# Patient Record
Sex: Female | Born: 1975 | Hispanic: No | Marital: Married
Health system: Southern US, Community
[De-identification: ages and names within clinical notes are randomized; demographics above are authoritative.]

## PROBLEM LIST (undated history)

## (undated) DIAGNOSIS — J45909 Unspecified asthma, uncomplicated: Secondary | ICD-10-CM

## (undated) HISTORY — PX: HERNIA REPAIR: SHX51

## (undated) HISTORY — PX: FOOT SURGERY: SHX648

---

## 1999-01-06 ENCOUNTER — Other Ambulatory Visit: Admission: RE | Admit: 1999-01-06 | Discharge: 1999-01-06 | Payer: Self-pay | Admitting: Family Medicine

## 2020-09-05 ENCOUNTER — Emergency Department (HOSPITAL_BASED_OUTPATIENT_CLINIC_OR_DEPARTMENT_OTHER)
Admission: EM | Admit: 2020-09-05 | Discharge: 2020-09-05 | Disposition: A | Discharge status: Home / Self Care | Payer: Self-pay | Attending: Emergency Medicine | Admitting: Emergency Medicine

## 2020-09-05 ENCOUNTER — Encounter (HOSPITAL_BASED_OUTPATIENT_CLINIC_OR_DEPARTMENT_OTHER): Payer: Self-pay | Admitting: Emergency Medicine

## 2020-09-05 ENCOUNTER — Emergency Department (HOSPITAL_BASED_OUTPATIENT_CLINIC_OR_DEPARTMENT_OTHER): Payer: Self-pay

## 2020-09-05 ENCOUNTER — Other Ambulatory Visit: Payer: Self-pay

## 2020-09-05 DIAGNOSIS — R0789 Other chest pain: Secondary | ICD-10-CM | POA: Insufficient documentation

## 2020-09-05 DIAGNOSIS — J45909 Unspecified asthma, uncomplicated: Secondary | ICD-10-CM | POA: Insufficient documentation

## 2020-09-05 HISTORY — DX: Unspecified asthma, uncomplicated: J45.909

## 2020-09-05 LAB — BASIC METABOLIC PANEL
Anion gap: 5 (ref 5–15)
BUN: 16 mg/dL (ref 6–20)
CO2: 27 mmol/L (ref 22–32)
Calcium: 9 mg/dL (ref 8.9–10.3)
Chloride: 105 mmol/L (ref 98–111)
Creatinine, Ser: 0.74 mg/dL (ref 0.44–1.00)
GFR, Estimated: >60 mL/min (ref >60)
Glucose, Bld: 98 mg/dL (ref 70–99)
Potassium: 3.9 mmol/L (ref 3.5–5.1)
Sodium: 137 mmol/L (ref 135–145)

## 2020-09-05 LAB — CBC
HCT: 39 % (ref 36.0–46.0)
Hemoglobin: 12.7 g/dL (ref 12.0–15.0)
MCH: 26.8 pg (ref 26.0–34.0)
MCHC: 32.6 g/dL (ref 30.0–36.0)
MCV: 82.5 fL (ref 80.0–100.0)
Platelets: 233 10*3/uL (ref 150–400)
RBC: 4.73 MIL/uL (ref 3.87–5.11)
RDW: 12.6 % (ref 11.5–15.5)
WBC: 4.2 10*3/uL (ref 4.0–10.5)
nRBC: 0 % (ref 0.0–0.2)

## 2020-09-05 LAB — TROPONIN I (HIGH SENSITIVITY)
Troponin I (High Sensitivity): <2 ng/L
Troponin I (High Sensitivity): <2 ng/L (ref <18)

## 2020-09-05 LAB — PREGNANCY, URINE: Preg Test, Ur: NEGATIVE

## 2020-09-05 MED ORDER — LACTATED RINGERS IV BOLUS
1000.0000 mL | Freq: Once | INTRAVENOUS | Status: AC
  Administered 2020-09-05: 1000 mL via INTRAVENOUS

## 2020-09-05 NOTE — ED Triage Notes (Signed)
Pt reports "constant" chest pain that started 3 days ago. Pt also reports left arm pain.

## 2020-09-05 NOTE — ED Provider Notes (Signed)
[facility] EMERGENCY DEPARTMENT Provider Note   CSN: [id number] Arrival date & time: 09/05/20  9728     History Chief Complaint  Patient presents with   Chest Pain    Deanna Lamb is a 45 y.o. female.  The history is provided by the patient.  Chest Pain Pain location:  L chest and L lateral chest Pain quality: aching and tightness   Pain radiates to:  Does not radiate Pain severity:  Moderate Onset quality:  Gradual Duration:  3 days Timing:  Constant Progression:  Unchanged Chronicity:  New Context comment:  Nothing seems to make it better or worse Relieved by:  Nothing Worsened by:  Nothing Ineffective treatments: Ibuprofen and Tagamet. Associated symptoms: no abdominal pain, no back pain, no cough, no fever, no lower extremity edema, no nausea, no palpitations, no shortness of breath, no syncope, no vomiting and no weakness   Risk factors comment:  History of asthma and depression     Past Medical History:  Diagnosis Date   Asthma     There are no problems to display for this patient.   Past Surgical History:  Procedure Laterality Date   FOOT SURGERY Right    HERNIA REPAIR       OB History   No obstetric history on file.     No family history on file.     Home Medications Prior to Admission medications   Medication Sig Start Date End Date Taking? Authorizing Provider  ALPRAZolam Prudy Feeler) 0.5 MG tablet Take 0.5 mg by mouth at bedtime as needed for anxiety.   Yes [provider]  escitalopram (LEXAPRO) 20 MG tablet Take 20 mg by mouth daily.   Yes [provider]    Allergies    Patient has no known allergies.  Review of Systems   Review of Systems  Constitutional:  Negative for fever.  Respiratory:  Negative for cough and shortness of breath.   Cardiovascular:  Positive for chest pain. Negative for palpitations and syncope.  Gastrointestinal:  Negative for abdominal pain, nausea and vomiting.  Musculoskeletal:   Negative for back pain.  Neurological:  Negative for weakness.  All other systems reviewed and are negative.  Physical Exam Updated Vital Signs BP 114/66 (BP Location: Right Arm)   Pulse 60   Temp 97.9 F (36.6 C) (Oral)   Resp 20   Ht 5\' 3"  (1.6 m)   Wt 58.1 kg   LMP 08/21/2020   SpO2 100%   BMI 22.67 kg/m   Physical Exam Vitals and nursing note reviewed.  Constitutional:      General: She is not in acute distress.    Appearance: Normal appearance. She is well-developed and normal weight.  HENT:     Head: Normocephalic and atraumatic.     Mouth/Throat:     Mouth: Mucous membranes are moist.  Eyes:     Pupils: Pupils are equal, round, and reactive to light.  Cardiovascular:     Rate and Rhythm: Normal rate and regular rhythm.     Pulses: Normal pulses.     Heart sounds: Normal heart sounds. No murmur heard.   No friction rub.  Pulmonary:     Effort: Pulmonary effort is normal.     Breath sounds: Normal breath sounds. No wheezing or rales.     Comments: Mild tenderness in the left chest wall.  No rashes present Chest:     Chest wall: Tenderness present.  Abdominal:     General: Bowel sounds  are normal. There is no distension.     Palpations: Abdomen is soft.     Tenderness: There is no abdominal tenderness. There is no guarding or rebound.  Musculoskeletal:        General: No tenderness. Normal range of motion.     Right lower leg: No edema.     Left lower leg: No edema.     Comments: No edema  Skin:    General: Skin is warm and dry.     Findings: No rash.  Neurological:     General: No focal deficit present.     Mental Status: She is alert and oriented to person, place, and time. Mental status is at baseline.     Cranial Nerves: No cranial nerve deficit.  Psychiatric:        Mood and Affect: Mood normal.        Behavior: Behavior normal.    ED Results / Procedures / Treatments   Labs (all labs ordered are listed, but only abnormal results are  displayed) Labs Reviewed  BASIC METABOLIC PANEL  CBC  PREGNANCY, URINE  TROPONIN I (HIGH SENSITIVITY)  TROPONIN I (HIGH SENSITIVITY)    EKG EKG Interpretation  Date/Time:  Friday September 05 2020 06:56:51 EDT Ventricular Rate:  67 PR Interval:  144 QRS Duration: 84 QT Interval:  396 QTC Calculation: 418 R Axis:   60 Text Interpretation: Normal sinus rhythm Normal ECG No previous tracing Confirmed by Gwyneth Sprout ([id number]) on 09/05/2020 8:02:33 AM  Radiology DG Chest 2 View  Result Date: 09/05/2020 CLINICAL DATA:  45 year old female with history of chest pain for the past 3 days. EXAM: CHEST - 2 VIEW COMPARISON:  No priors. FINDINGS: Lung volumes are normal. No consolidative airspace disease. No pleural effusions. No pneumothorax. No pulmonary nodule or mass noted. Pulmonary vasculature and the cardiomediastinal silhouette are within normal limits. IMPRESSION: No radiographic evidence of acute cardiopulmonary disease. Electronically Signed   By: Trudie Reed M.D.   On: 09/05/2020 07:22    Procedures Procedures   Medications Ordered in ED Medications  lactated ringers bolus 1,000 mL (0 mLs Intravenous Stopped 09/05/20 0943)    ED Course  I have reviewed the triage vital signs and the nursing notes.  Pertinent labs & imaging results that were available during my care of the patient were reviewed by me and considered in my medical decision making (see chart for details).    MDM Rules/Calculators/A&P                          Patient is a 45 year old female with no known medical history including no tobacco or OCP use who is presenting today with a left-sided constant chest pain that has been present for the last 3 days.  It is a tight heavy sensation on the left side of her chest that is slightly worse with palpation.  She has had no other associated symptoms.  No symptoms to suggest pneumonia, PERC negative and low suspicion for PE, low suspicion for dissection.  She has no  abdominal symptoms to suggest GERD.  EKG is normal and labs are reassuring with negative troponin with symptoms constant for the last 3 days feel that 1 troponin is adequate.  Chest x-ray without acute findings of pneumothorax or cardiac enlargement.  Heart score of 1 and low suspicion for ACS.  Suspect musculoskeletal in nature.  Patient given reassurance.  Return precautions and follow-up with PCP if not improved.  MDM   Amount and/or Complexity of Data Reviewed Clinical lab tests: ordered and reviewed Tests in the radiology section of CPT: ordered and reviewed Tests in the medicine section of CPT: ordered and reviewed Independent visualization of images, tracings, or specimens: yes    Final Clinical Impression(s) / ED Diagnoses Final diagnoses:  Chest wall pain    Rx / DC Orders ED Discharge Orders     None        Gwyneth Sprout, MD 09/05/20 1015

## 2020-09-05 NOTE — Discharge Instructions (Addendum)
Today everything with your heart, lungs looks normal.  No signs of heart attack.  Suspect you did something to irritate the muscles and nerves in your chest.  You can use Tylenol and ibuprofen but it should resolve within a week.  If you start developing shortness of breath, cough, fever, passing out or other concerns please return to the emergency room.

## 2022-07-03 IMAGING — CR DG CHEST 2V
2 series · 2 of 2 positions shown · non-contrast
Comparison: No priors.

CLINICAL DATA: 44-year-old female with history of chest pain for
the past 3 days.

EXAM:
CHEST - 2 VIEW

[w chest pa]
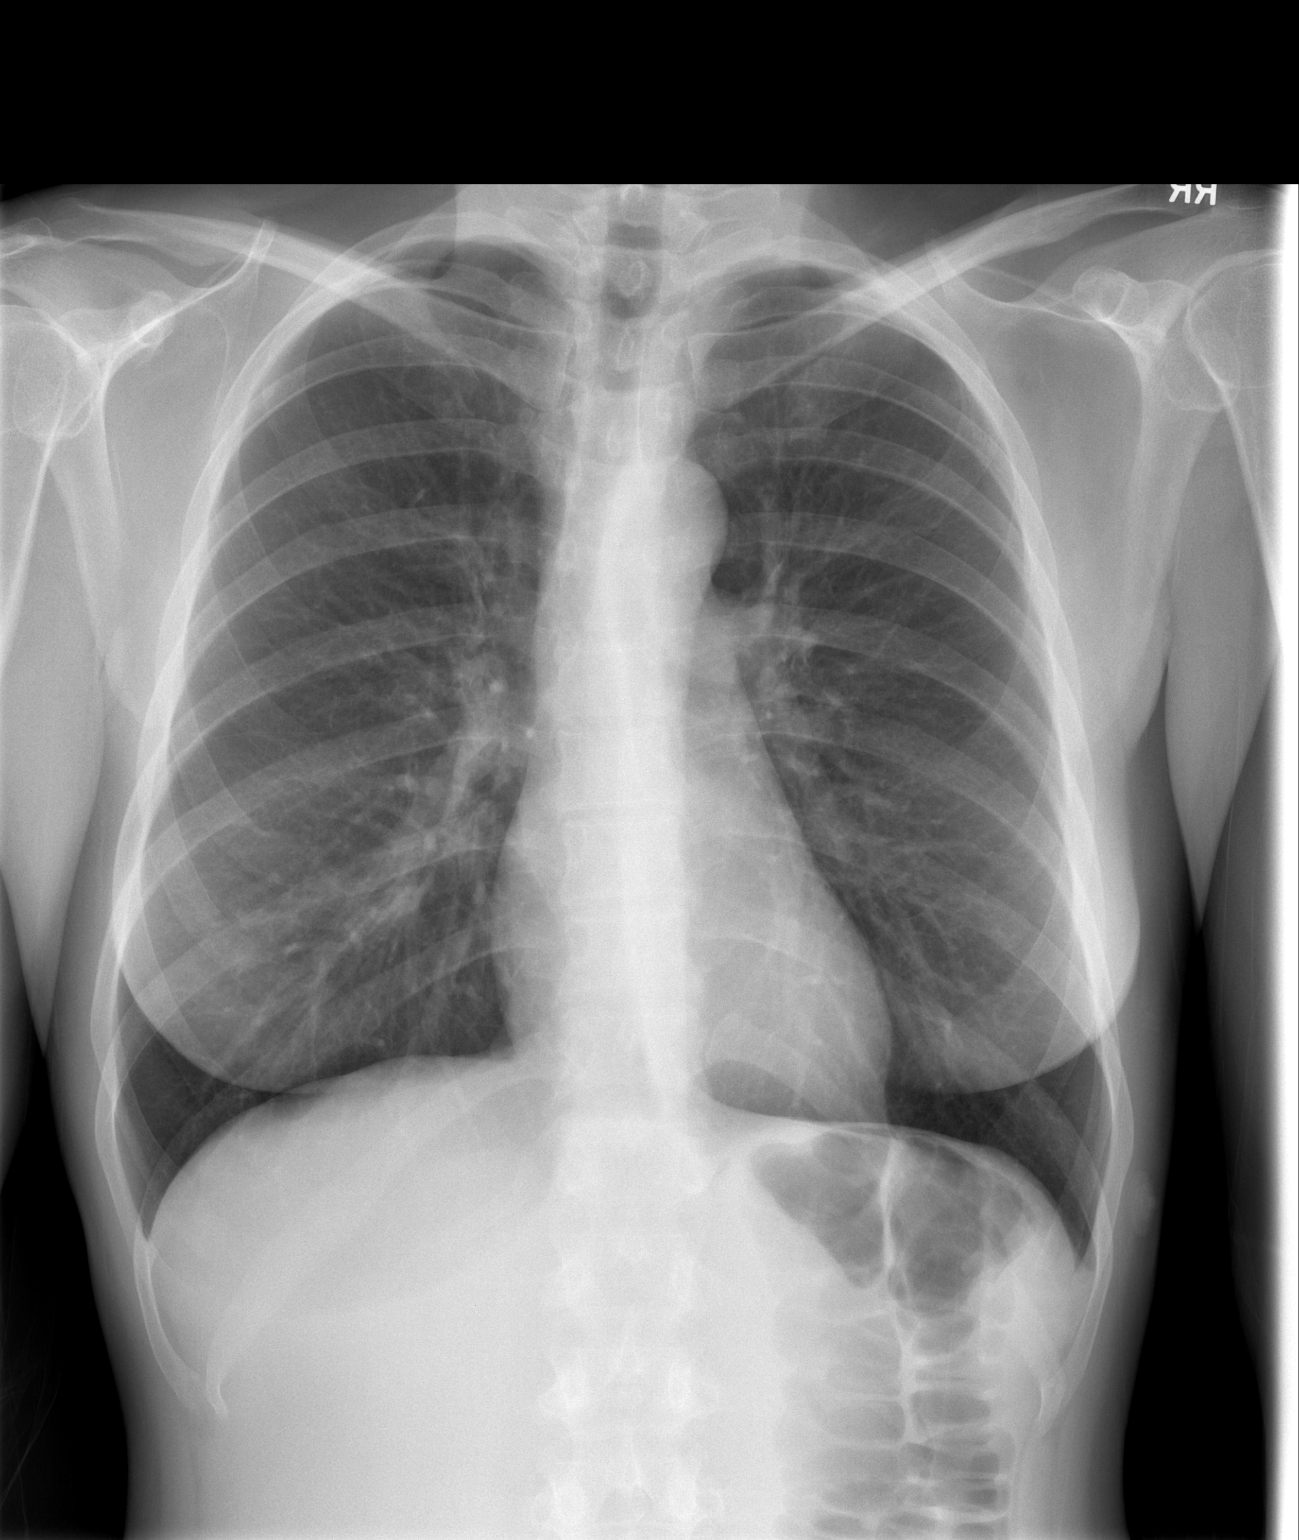

[w chest lat]
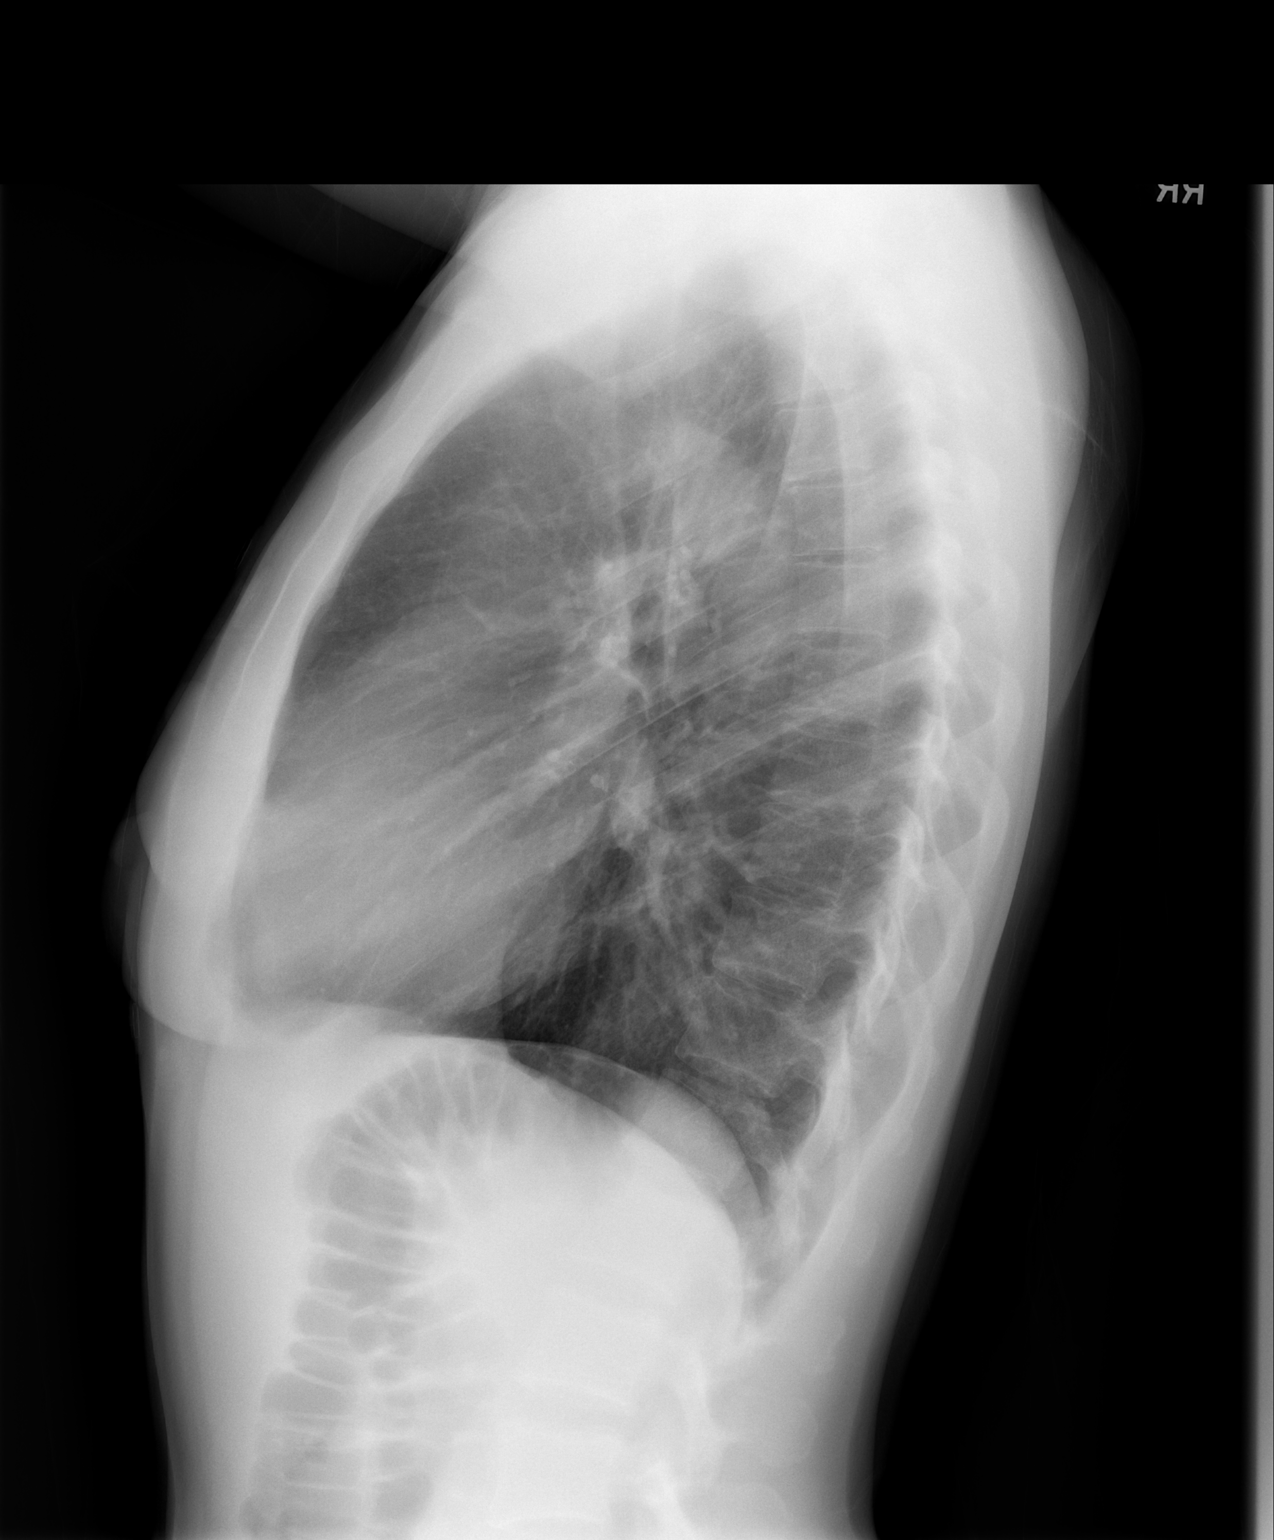

[2 of 2 positions shown; findings below may reference images not displayed]

FINDINGS: Lung volumes are normal. No consolidative airspace disease. No
pleural effusions. No pneumothorax. No pulmonary nodule or mass
noted. Pulmonary vasculature and the cardiomediastinal silhouette
are within normal limits.
IMPRESSION: No radiographic evidence of acute cardiopulmonary disease.
